# Patient Record
Sex: Male | Born: 1985 | Hispanic: No | Marital: Married | Smoking: Current every day smoker
Health system: Southern US, Community
[De-identification: ages and names within clinical notes are randomized; demographics above are authoritative.]

## PROBLEM LIST (undated history)

## (undated) DIAGNOSIS — J45909 Unspecified asthma, uncomplicated: Secondary | ICD-10-CM

---
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
## 2020-01-23 ENCOUNTER — Other Ambulatory Visit: Payer: Self-pay

## 2020-01-23 ENCOUNTER — Other Ambulatory Visit: Payer: Self-pay | Admitting: Critical Care Medicine

## 2020-01-23 DIAGNOSIS — Z20822 Contact with and (suspected) exposure to covid-19: Secondary | ICD-10-CM

## 2020-01-25 LAB — NOVEL CORONAVIRUS, NAA: SARS-CoV-2, NAA: NOT DETECTED

## 2020-01-25 LAB — SARS-COV-2, NAA 2 DAY TAT

## 2020-06-19 ENCOUNTER — Ambulatory Visit
Admission: EM | Admit: 2020-06-19 | Discharge: 2020-06-19 | Disposition: A | Discharge status: Home / Self Care | Payer: Self-pay | Attending: Emergency Medicine | Admitting: Emergency Medicine

## 2020-06-19 ENCOUNTER — Ambulatory Visit (INDEPENDENT_AMBULATORY_CARE_PROVIDER_SITE_OTHER): Payer: Self-pay

## 2020-06-19 ENCOUNTER — Other Ambulatory Visit: Payer: Self-pay

## 2020-06-19 ENCOUNTER — Encounter: Payer: Self-pay | Admitting: Emergency Medicine

## 2020-06-19 DIAGNOSIS — W19XXXA Unspecified fall, initial encounter: Secondary | ICD-10-CM

## 2020-06-19 DIAGNOSIS — S62615A Displaced fracture of proximal phalanx of left ring finger, initial encounter for closed fracture: Secondary | ICD-10-CM

## 2020-06-19 DIAGNOSIS — S63259A Unspecified dislocation of unspecified finger, initial encounter: Secondary | ICD-10-CM

## 2020-06-19 DIAGNOSIS — M79642 Pain in left hand: Secondary | ICD-10-CM

## 2020-06-19 HISTORY — DX: Unspecified asthma, uncomplicated: J45.909

## 2020-06-19 MED ORDER — IBUPROFEN 800 MG PO TABS: 800.0000 mg | ORAL_TABLET | Freq: Three times a day (TID) | ORAL | 0 refills | Status: AC

## 2020-06-19 NOTE — ED Triage Notes (Signed)
Pt presents today with pain to left fourth finger. He reports slipping on ice today and fell onto left hand. No deformity noted.

## 2020-06-19 NOTE — Discharge Instructions (Addendum)
Please follow-up with orthopedics- contact below Use anti-inflammatories for pain/swelling. You may take up to 800 mg Ibuprofen every 8 hours with food. You may supplement Ibuprofen with Tylenol [phone] mg every 8 hours.  Wear splint 24/7 until cleared by ortho

## 2020-06-19 NOTE — ED Provider Notes (Signed)
[facility]    CSN: [id number] Arrival date & time: 06/19/20  1143      History   Chief Complaint Chief Complaint  Patient presents with  . Hand Pain    left  . Fall    HPI Michael Byrd is a 35 y.o. male history of asthma presenting today for evaluation of finger injury.  Patient slipped and fell today, landed on left hand and jammed left fourth finger into the ground.  Since he has noticed deformity and pain to the tip of his finger and reports being unable to bend at the DIP.  HPI  Past Medical History:  Diagnosis Date  . Asthma     There are no problems to display for this patient.   History reviewed. No pertinent surgical history.     Home Medications    Prior to Admission medications   Medication Sig Start Date End Date Taking? Authorizing Provider  ibuprofen (ADVIL) 800 MG tablet Take 1 tablet (800 mg total) by mouth 3 (three) times daily. 06/19/20  Yes Sharlee Rufino, [place] C, PA-C    Family History No family history on file.  Social History Social History   Tobacco Use  . Smoking status: Current Every Day Smoker  . Smokeless tobacco: Never Used     Allergies   Patient has no known allergies.   Review of Systems Review of Systems  Constitutional: Negative for fatigue and fever.  Eyes: Negative for redness, itching and visual disturbance.  Respiratory: Negative for shortness of breath.   Cardiovascular: Negative for chest pain and leg swelling.  Gastrointestinal: Negative for nausea and vomiting.  Musculoskeletal: Positive for arthralgias and joint swelling. Negative for myalgias.  Skin: Negative for color change, rash and wound.  Neurological: Negative for dizziness, syncope, weakness, light-headedness and headaches.     Physical Exam Triage Vital Signs ED Triage Vitals  Enc Vitals Group     BP 06/19/20 1228 (!) 138/92     Pulse Rate 06/19/20 1228 93     Resp 06/19/20 1228 18     Temp 06/19/20 1228 98.4 F (36.9 C)     Temp  Source 06/19/20 1228 Oral     SpO2 06/19/20 1228 96 %     Weight 06/19/20 1229 187 lb (84.8 kg)     Height 06/19/20 1229 5\' 10"  (1.778 m)     Head Circumference --      Peak Flow --      Pain Score 06/19/20 1228 8     Pain Loc --      Pain Edu? --      Excl. in GC? --    No data found.  Updated Vital Signs BP (!) 138/92 (BP Location: Right Arm)   Pulse 93   Temp 98.4 F (36.9 C) (Oral)   Resp 18   Ht 5\' 10"  (1.778 m)   Wt 187 lb (84.8 kg)   SpO2 96%   BMI 26.83 kg/m   Visual Acuity Right Eye Distance:   Left Eye Distance:   Bilateral Distance:    Right Eye Near:   Left Eye Near:    Bilateral Near:     Physical Exam Vitals and nursing note reviewed.  Constitutional:      Appearance: He is well-developed and well-nourished.     Comments: No acute distress  HENT:     Head: Normocephalic and atraumatic.     Nose: Nose normal.  Eyes:     Conjunctiva/sclera: Conjunctivae normal.  Cardiovascular:  Rate and Rhythm: Normal rate.  Pulmonary:     Effort: Pulmonary effort is normal. No respiratory distress.  Abdominal:     General: There is no distension.  Musculoskeletal:        General: Normal range of motion.     Cervical back: Neck supple.     Comments: Left hand: Left fourth finger with deformity noted at DIP and distal phalanx, tender to palpation, full active range of motion at PIP of fourth finger without tenderness to palpation to proximal phalanx and MCP joint  After reduction deformity improved  Skin:    General: Skin is warm and dry.  Neurological:     Mental Status: He is alert and oriented to person, place, and time.  Psychiatric:        Mood and Affect: Mood and affect normal.      UC Treatments / Results  Labs (all labs ordered are listed, but only abnormal results are displayed) Labs Reviewed - No data to display  EKG   Radiology DG Hand Complete Left  Result Date: 06/19/2020 CLINICAL DATA:  Status post fall with left hand pain.  EXAM: LEFT HAND - COMPLETE 3+ VIEW COMPARISON:  June 14, 2007 FINDINGS: There is dislocation of the distal interphalangeal joint of the fourth digit. No acute fracture is noted. IMPRESSION: Dislocation of the distal interphalangeal joint of the fourth digit. Electronically Signed   By: Sherian Rein M.D.   On: 06/19/2020 13:02   DG Finger Ring Left  Result Date: 06/19/2020 CLINICAL DATA:  Post reduction EXAM: LEFT RING FINGER 2+V COMPARISON:  January 22nd 2022 12:39 p.m. FINDINGS: There is no evidence of fracture or dislocation. T soft tissues are unremarkable. IMPRESSION: Previously noted fourth digit dislocation has been reduced. No acute fracture or dislocation identified. Electronically Signed   By: Sherian Rein M.D.   On: 06/19/2020 13:49    Procedures Procedures (including critical care time)  Distal phalanx reduction-digital block performed to base of fourth finger with 2% lidocaine, approximately 2 cc injected separately at base of finger with 25-gauge needle, 2 separate introductions, perpendicular to finger; anesthesia achieved  Reduction performed with traction against finger and distal phalanx popped into place  Medications Ordered in UC Medications - No data to display  Initial Impression / Assessment and Plan / UC Course  I have reviewed the triage vital signs and the nursing notes.  Pertinent labs & imaging results that were available during my care of the patient were reviewed by me and considered in my medical decision making (see chart for details).     Fourth distal phalanx dislocation with reduction performed today.  No fracture noted.  Placing an static splint to avoid further dislocation, anti-inflammatories and follow-up with Ortho of to monitor healing.  Discussed strict return precautions. Patient verbalized understanding and is agreeable with plan.  Final Clinical Impressions(s) / UC Diagnoses   Final diagnoses:  Dislocation of finger, initial encounter      Discharge Instructions     Please follow-up with orthopedics- contact below Use anti-inflammatories for pain/swelling. You may take up to 800 mg Ibuprofen every 8 hours with food. You may supplement Ibuprofen with Tylenol [phone] every 8 hours.  Wear splint 24/7 until cleared by ortho    ED Prescriptions    Medication Sig Dispense Auth. Provider   ibuprofen (ADVIL) 800 MG tablet Take 1 tablet (800 mg total) by mouth 3 (three) times daily. 21 tablet Phuc Kluttz, Junius Creamer, PA-C     PDMP  not reviewed this encounter.   Lew Dawes, PA-C 06/19/20 1406

## 2020-07-16 ENCOUNTER — Encounter: Payer: Self-pay | Admitting: Emergency Medicine

## 2022-07-02 IMAGING — DX DG HAND COMPLETE 3+V*L*
3 series · 3 of 3 positions shown · non-contrast
Comparison: June 14, 2007

CLINICAL DATA: Status post fall with left hand pain.

EXAM:
LEFT HAND - COMPLETE 3+ VIEW

[hand pa]
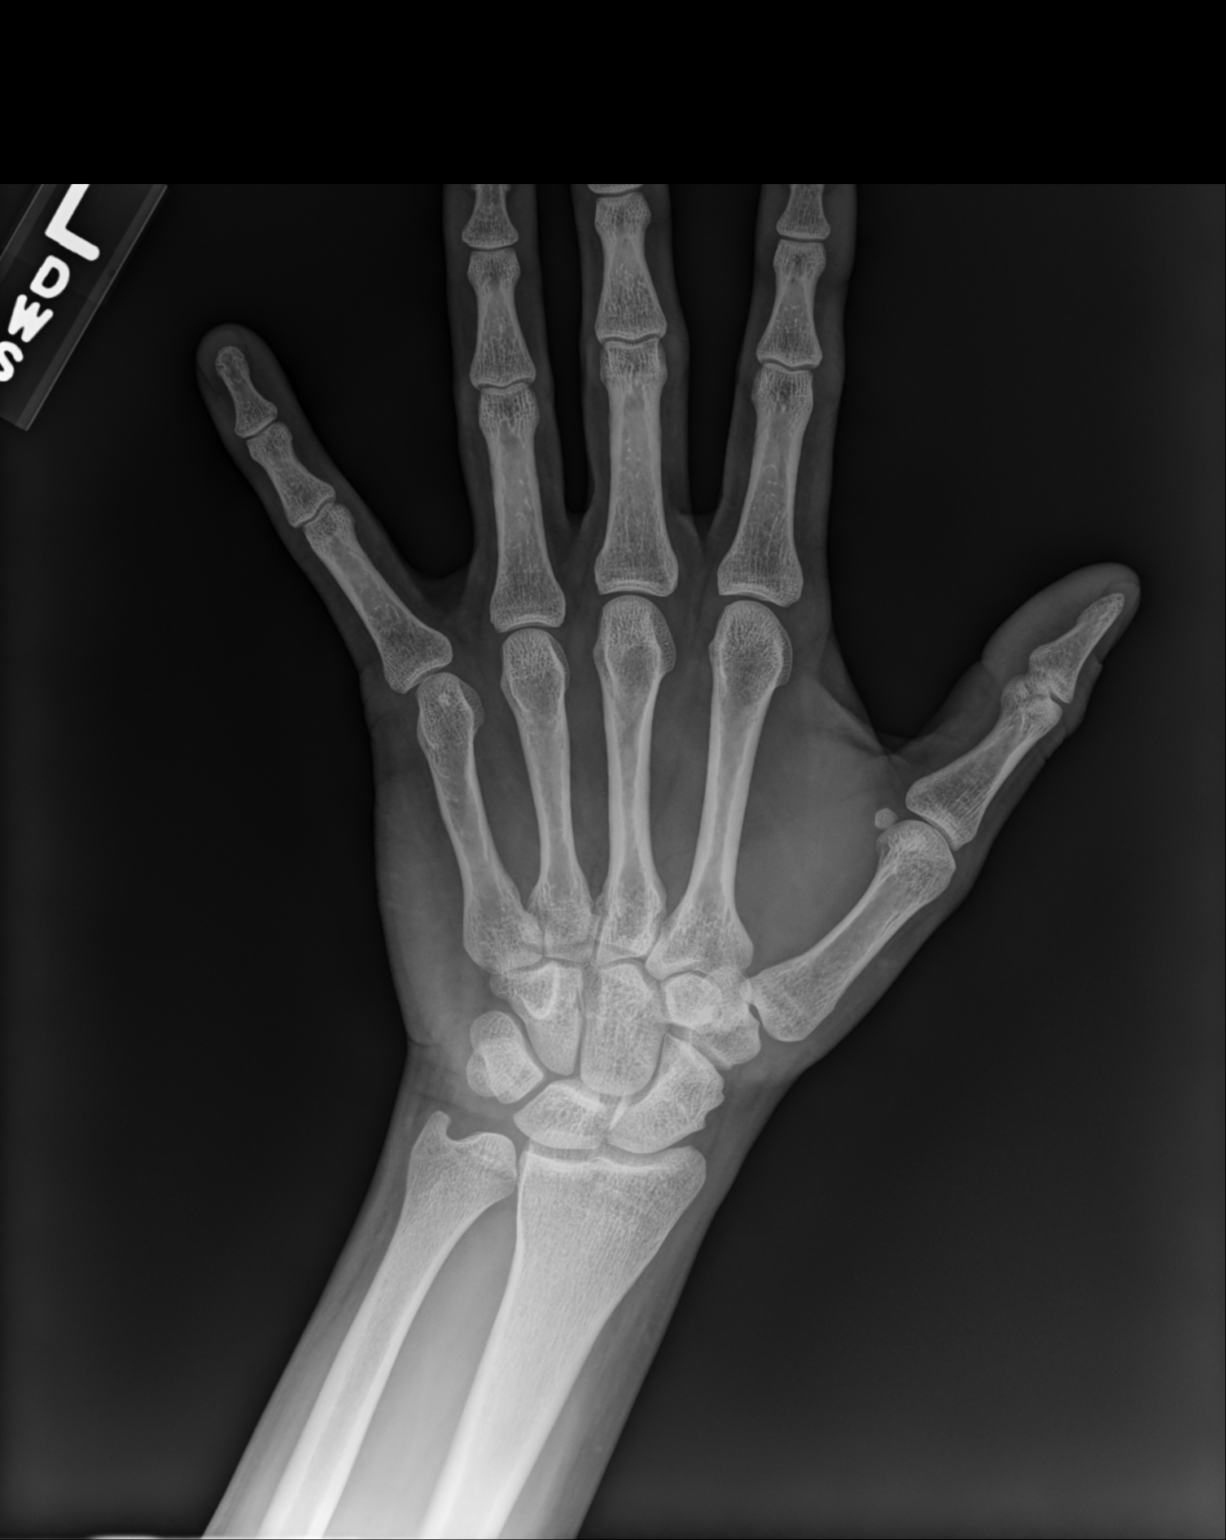

[hand mlo]
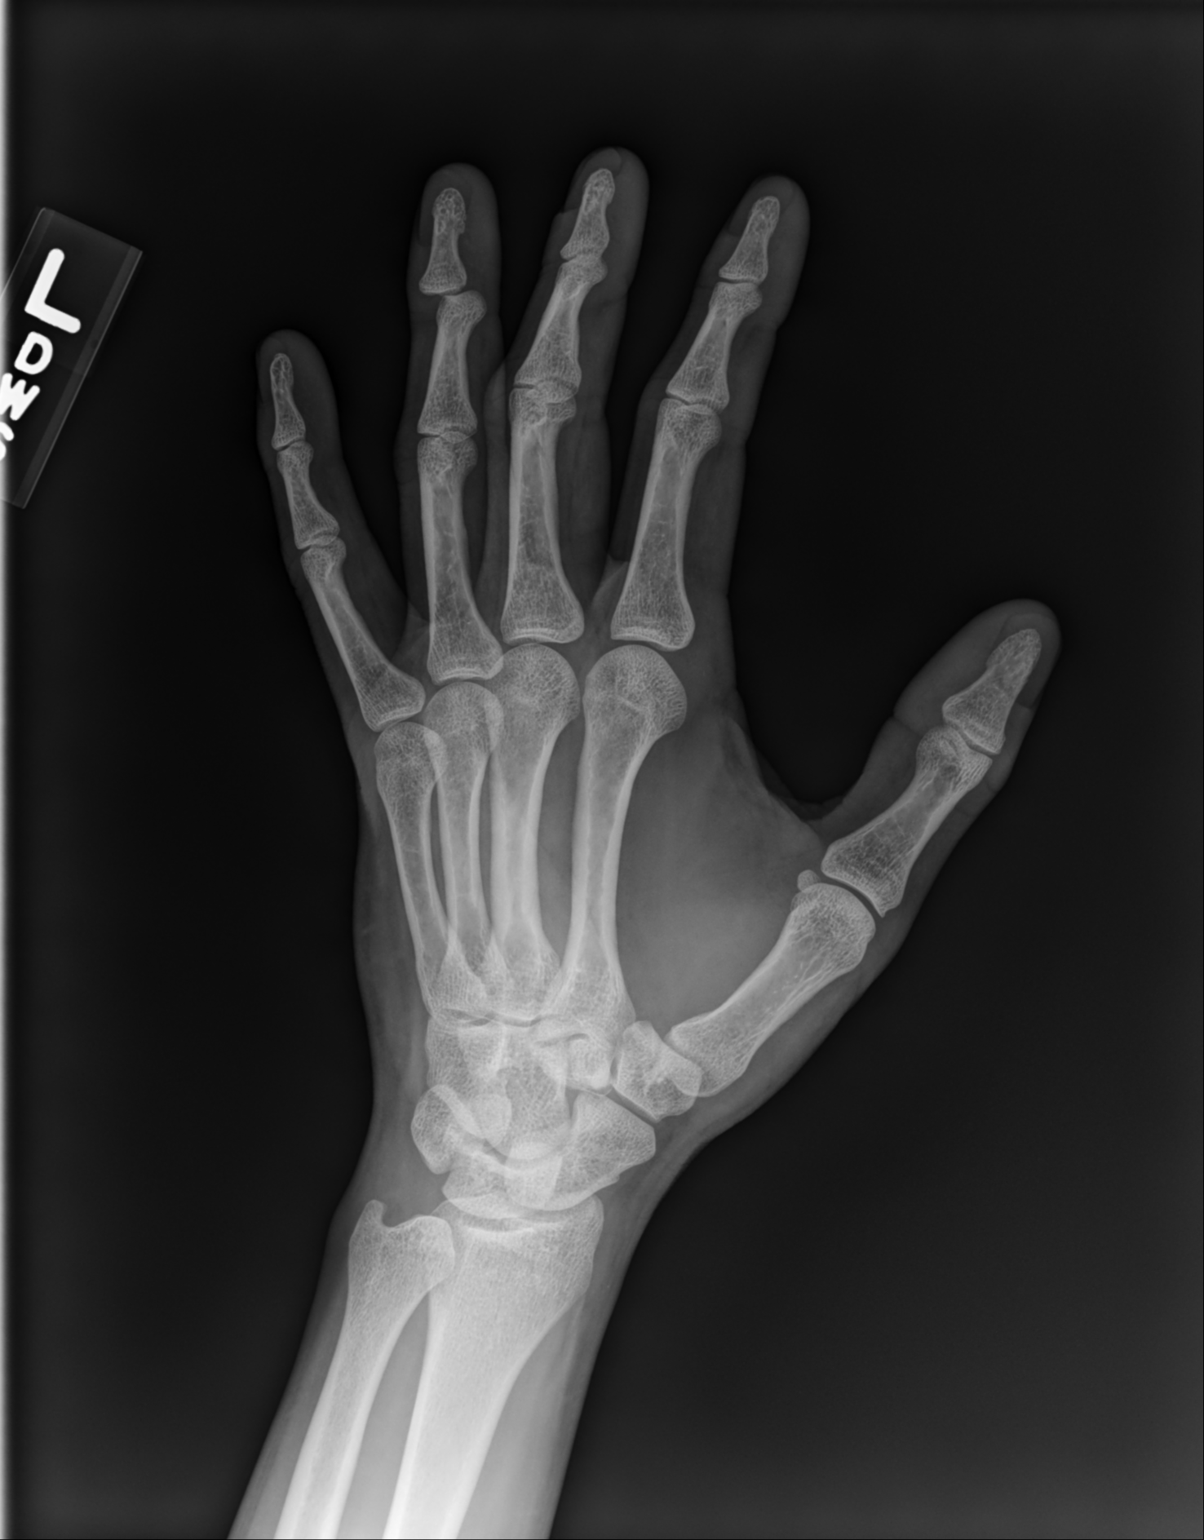

[hand lat]
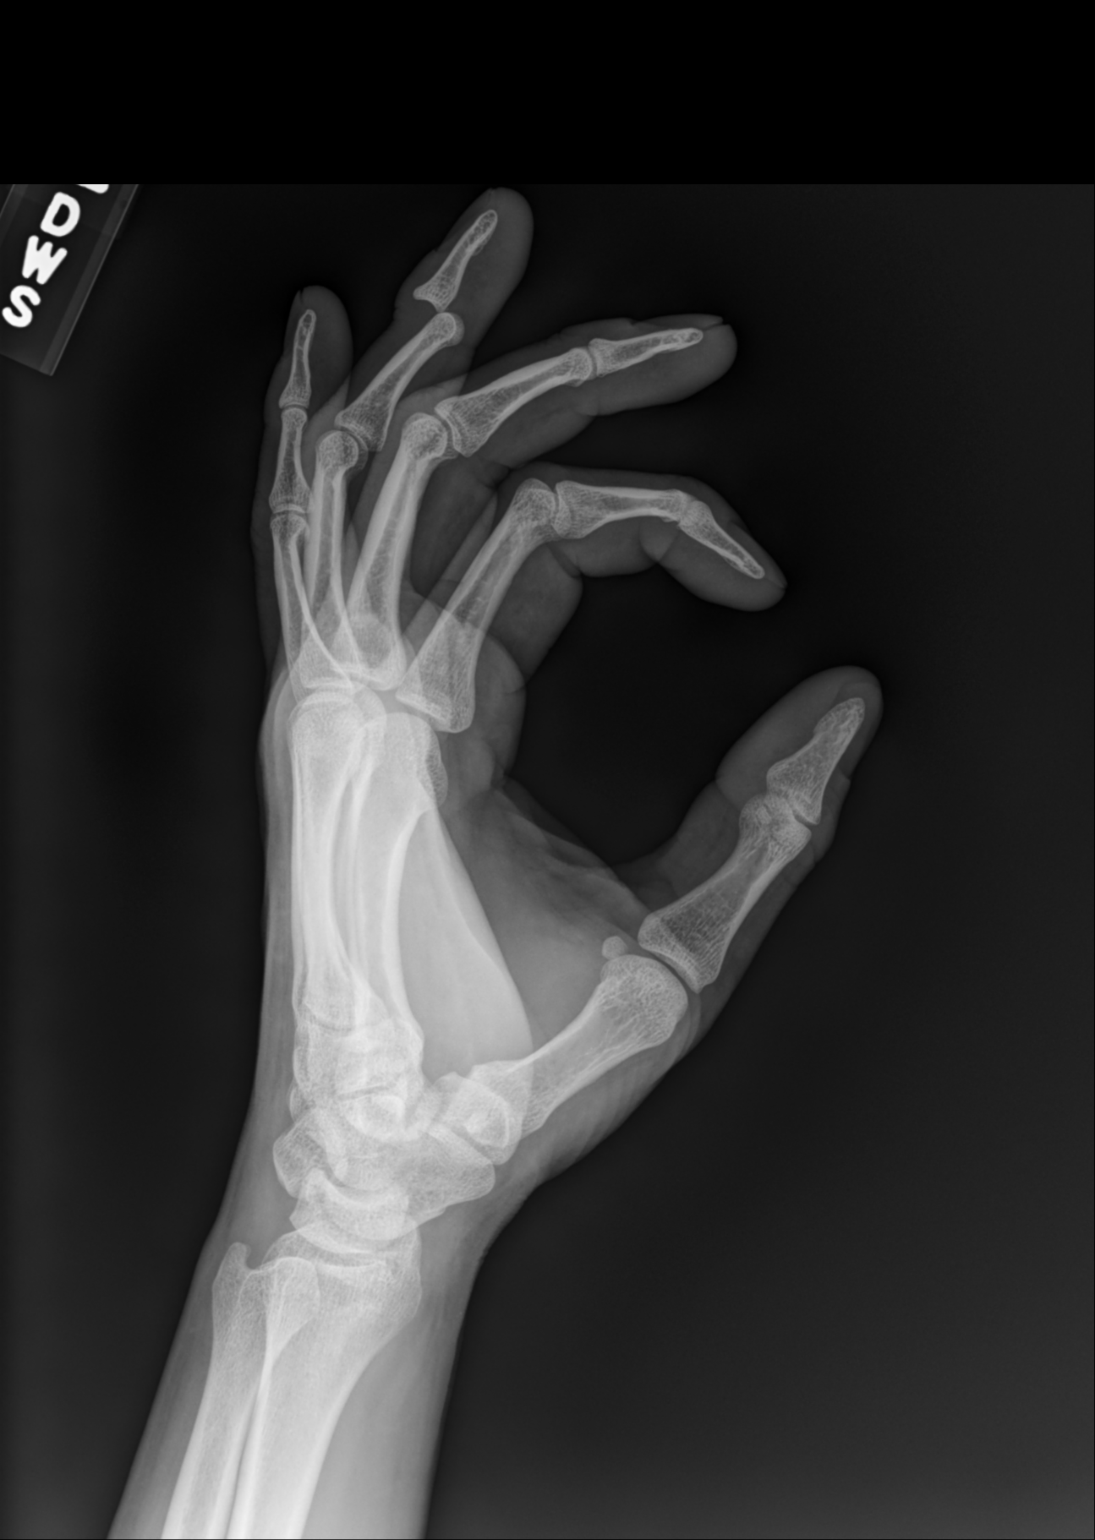

[3 of 3 positions shown; findings below may reference images not displayed]

FINDINGS: There is dislocation of the distal interphalangeal joint of the
fourth digit. No acute fracture is noted.
IMPRESSION: Dislocation of the distal interphalangeal joint of the fourth digit.
